# Patient Record
Sex: Female | Born: 1950 | Race: White | Hispanic: No | Marital: Married | Smoking: Never smoker
Health system: Southern US, Community
[De-identification: ages and names within clinical notes are randomized; demographics above are authoritative.]

---

## 1999-02-18 ENCOUNTER — Encounter: Payer: Self-pay | Admitting: Gynecology

## 1999-02-18 ENCOUNTER — Encounter: Admission: RE | Admit: 1999-02-18 | Discharge: 1999-02-18 | Payer: Self-pay | Admitting: Gynecology

## 2000-03-07 ENCOUNTER — Encounter: Admission: RE | Admit: 2000-03-07 | Discharge: 2000-03-07 | Payer: Self-pay | Admitting: Gynecology

## 2000-03-07 ENCOUNTER — Encounter: Payer: Self-pay | Admitting: Gynecology

## 2000-09-18 ENCOUNTER — Other Ambulatory Visit: Admission: RE | Admit: 2000-09-18 | Discharge: 2000-09-18 | Payer: Self-pay | Admitting: Gynecology

## 2001-01-16 ENCOUNTER — Encounter: Payer: Self-pay | Admitting: Family Medicine

## 2001-01-16 ENCOUNTER — Ambulatory Visit (HOSPITAL_COMMUNITY): Admission: RE | Admit: 2001-01-16 | Discharge: 2001-01-16 | Payer: Self-pay | Admitting: Family Medicine

## 2001-04-20 ENCOUNTER — Ambulatory Visit (HOSPITAL_COMMUNITY): Admission: RE | Admit: 2001-04-20 | Discharge: 2001-04-20 | Payer: Self-pay | Admitting: Family Medicine

## 2001-04-20 ENCOUNTER — Encounter: Payer: Self-pay | Admitting: Family Medicine

## 2001-06-12 ENCOUNTER — Encounter: Admission: RE | Admit: 2001-06-12 | Discharge: 2001-06-12 | Payer: Self-pay | Admitting: Gynecology

## 2001-06-12 ENCOUNTER — Encounter: Payer: Self-pay | Admitting: Gynecology

## 2001-10-10 ENCOUNTER — Other Ambulatory Visit: Admission: RE | Admit: 2001-10-10 | Discharge: 2001-10-10 | Payer: Self-pay | Admitting: Gynecology

## 2002-02-18 ENCOUNTER — Encounter: Admission: RE | Admit: 2002-02-18 | Discharge: 2002-02-18 | Payer: Self-pay | Admitting: Surgery

## 2002-02-18 ENCOUNTER — Encounter: Payer: Self-pay | Admitting: Surgery

## 2002-12-17 ENCOUNTER — Other Ambulatory Visit: Admission: RE | Admit: 2002-12-17 | Discharge: 2002-12-17 | Payer: Self-pay | Admitting: Gynecology

## 2004-01-20 ENCOUNTER — Other Ambulatory Visit: Admission: RE | Admit: 2004-01-20 | Discharge: 2004-01-20 | Payer: Self-pay | Admitting: Gynecology

## 2004-01-22 ENCOUNTER — Encounter: Admission: RE | Admit: 2004-01-22 | Discharge: 2004-01-22 | Payer: Self-pay | Admitting: Gynecology

## 2005-03-21 ENCOUNTER — Encounter: Admission: RE | Admit: 2005-03-21 | Discharge: 2005-03-21 | Payer: Self-pay | Admitting: Gynecology

## 2005-03-29 ENCOUNTER — Other Ambulatory Visit: Admission: RE | Admit: 2005-03-29 | Discharge: 2005-03-29 | Payer: Self-pay | Admitting: Gynecology

## 2005-04-06 ENCOUNTER — Encounter: Admission: RE | Admit: 2005-04-06 | Discharge: 2005-04-06 | Payer: Self-pay | Admitting: Gynecology

## 2006-03-27 ENCOUNTER — Encounter: Admission: RE | Admit: 2006-03-27 | Discharge: 2006-03-27 | Payer: Self-pay | Admitting: Gynecology

## 2006-04-11 ENCOUNTER — Other Ambulatory Visit: Admission: RE | Admit: 2006-04-11 | Discharge: 2006-04-11 | Payer: Self-pay | Admitting: Gynecology

## 2006-06-05 ENCOUNTER — Ambulatory Visit: Payer: Self-pay | Admitting: Gastroenterology

## 2006-06-20 ENCOUNTER — Ambulatory Visit: Payer: Self-pay | Admitting: Gastroenterology

## 2007-04-18 ENCOUNTER — Encounter: Admission: RE | Admit: 2007-04-18 | Discharge: 2007-04-18 | Payer: Self-pay | Admitting: Gynecology

## 2008-06-30 ENCOUNTER — Encounter: Admission: RE | Admit: 2008-06-30 | Discharge: 2008-06-30 | Payer: Self-pay | Admitting: Gynecology

## 2010-01-23 ENCOUNTER — Encounter: Payer: Self-pay | Admitting: Gynecology

## 2010-05-12 ENCOUNTER — Other Ambulatory Visit: Payer: Self-pay | Admitting: Gynecology

## 2010-05-12 DIAGNOSIS — Z1231 Encounter for screening mammogram for malignant neoplasm of breast: Secondary | ICD-10-CM

## 2010-05-16 ENCOUNTER — Ambulatory Visit
Admission: RE | Admit: 2010-05-16 | Discharge: 2010-05-16 | Disposition: A | Discharge status: Home / Self Care | Payer: Commercial Indemnity | Source: Ambulatory Visit | Attending: Gynecology | Admitting: Gynecology

## 2010-05-16 DIAGNOSIS — Z1231 Encounter for screening mammogram for malignant neoplasm of breast: Secondary | ICD-10-CM

## 2011-10-09 ENCOUNTER — Other Ambulatory Visit: Payer: Self-pay | Admitting: Gynecology

## 2011-10-09 DIAGNOSIS — Z1231 Encounter for screening mammogram for malignant neoplasm of breast: Secondary | ICD-10-CM

## 2011-11-13 ENCOUNTER — Ambulatory Visit
Admission: RE | Admit: 2011-11-13 | Discharge: 2011-11-13 | Disposition: A | Discharge status: Home / Self Care | Payer: Commercial Indemnity | Source: Ambulatory Visit | Attending: Gynecology | Admitting: Gynecology

## 2011-11-13 DIAGNOSIS — Z1231 Encounter for screening mammogram for malignant neoplasm of breast: Secondary | ICD-10-CM

## 2012-09-18 ENCOUNTER — Other Ambulatory Visit: Payer: Self-pay | Admitting: Family Medicine

## 2012-09-18 DIAGNOSIS — R110 Nausea: Secondary | ICD-10-CM

## 2012-09-18 DIAGNOSIS — R1011 Right upper quadrant pain: Secondary | ICD-10-CM

## 2012-09-23 ENCOUNTER — Ambulatory Visit
Admission: RE | Admit: 2012-09-23 | Discharge: 2012-09-23 | Disposition: A | Discharge status: Home / Self Care | Payer: Self-pay | Source: Ambulatory Visit | Attending: Family Medicine | Admitting: Family Medicine

## 2012-09-23 DIAGNOSIS — R1011 Right upper quadrant pain: Secondary | ICD-10-CM

## 2012-09-23 DIAGNOSIS — R110 Nausea: Secondary | ICD-10-CM

## 2013-08-12 ENCOUNTER — Other Ambulatory Visit: Payer: Self-pay

## 2013-08-12 DIAGNOSIS — Z1231 Encounter for screening mammogram for malignant neoplasm of breast: Secondary | ICD-10-CM

## 2013-08-18 ENCOUNTER — Ambulatory Visit
Admission: RE | Admit: 2013-08-18 | Discharge: 2013-08-18 | Disposition: A | Discharge status: Home / Self Care | Payer: Managed Care, Other (non HMO) | Source: Ambulatory Visit

## 2013-08-18 DIAGNOSIS — Z1231 Encounter for screening mammogram for malignant neoplasm of breast: Secondary | ICD-10-CM

## 2014-10-19 ENCOUNTER — Other Ambulatory Visit: Payer: Self-pay

## 2014-10-19 DIAGNOSIS — Z1231 Encounter for screening mammogram for malignant neoplasm of breast: Secondary | ICD-10-CM

## 2014-11-18 ENCOUNTER — Ambulatory Visit
Admission: RE | Admit: 2014-11-18 | Discharge: 2014-11-18 | Disposition: A | Discharge status: Home / Self Care | Payer: Managed Care, Other (non HMO) | Source: Ambulatory Visit

## 2014-11-18 DIAGNOSIS — Z1231 Encounter for screening mammogram for malignant neoplasm of breast: Secondary | ICD-10-CM

## 2015-11-11 ENCOUNTER — Other Ambulatory Visit: Payer: Self-pay | Admitting: Obstetrics and Gynecology

## 2015-11-11 DIAGNOSIS — Z1231 Encounter for screening mammogram for malignant neoplasm of breast: Secondary | ICD-10-CM

## 2015-12-13 ENCOUNTER — Ambulatory Visit
Admission: RE | Admit: 2015-12-13 | Discharge: 2015-12-13 | Disposition: A | Discharge status: Home / Self Care | Payer: Managed Care, Other (non HMO) | Source: Ambulatory Visit | Attending: Obstetrics and Gynecology | Admitting: Obstetrics and Gynecology

## 2015-12-13 DIAGNOSIS — Z1231 Encounter for screening mammogram for malignant neoplasm of breast: Secondary | ICD-10-CM

## 2016-11-14 ENCOUNTER — Other Ambulatory Visit: Payer: Self-pay | Admitting: Obstetrics and Gynecology

## 2016-11-14 DIAGNOSIS — Z1231 Encounter for screening mammogram for malignant neoplasm of breast: Secondary | ICD-10-CM

## 2016-12-13 ENCOUNTER — Ambulatory Visit
Admission: RE | Admit: 2016-12-13 | Discharge: 2016-12-13 | Disposition: A | Discharge status: Home / Self Care | Payer: Managed Care, Other (non HMO) | Source: Ambulatory Visit | Attending: Obstetrics and Gynecology | Admitting: Obstetrics and Gynecology

## 2016-12-13 DIAGNOSIS — Z1231 Encounter for screening mammogram for malignant neoplasm of breast: Secondary | ICD-10-CM

## 2018-01-28 ENCOUNTER — Other Ambulatory Visit: Payer: Self-pay | Admitting: Obstetrics and Gynecology

## 2018-01-28 DIAGNOSIS — Z1231 Encounter for screening mammogram for malignant neoplasm of breast: Secondary | ICD-10-CM

## 2018-02-19 ENCOUNTER — Ambulatory Visit
Admission: RE | Admit: 2018-02-19 | Discharge: 2018-02-19 | Disposition: A | Discharge status: Home / Self Care | Payer: Managed Care, Other (non HMO) | Source: Ambulatory Visit | Attending: Obstetrics and Gynecology | Admitting: Obstetrics and Gynecology

## 2018-02-19 DIAGNOSIS — Z1231 Encounter for screening mammogram for malignant neoplasm of breast: Secondary | ICD-10-CM

## 2018-07-09 ENCOUNTER — Other Ambulatory Visit: Payer: Self-pay | Admitting: Obstetrics and Gynecology

## 2018-07-09 DIAGNOSIS — R5381 Other malaise: Secondary | ICD-10-CM

## 2018-07-24 ENCOUNTER — Other Ambulatory Visit: Payer: Self-pay | Admitting: Obstetrics and Gynecology

## 2018-07-24 DIAGNOSIS — E2839 Other primary ovarian failure: Secondary | ICD-10-CM

## 2018-10-03 ENCOUNTER — Other Ambulatory Visit: Payer: Self-pay

## 2018-10-03 ENCOUNTER — Ambulatory Visit
Admission: RE | Admit: 2018-10-03 | Discharge: 2018-10-03 | Disposition: A | Discharge status: Home / Self Care | Payer: Managed Care, Other (non HMO) | Source: Ambulatory Visit | Attending: Obstetrics and Gynecology | Admitting: Obstetrics and Gynecology

## 2018-10-03 DIAGNOSIS — E2839 Other primary ovarian failure: Secondary | ICD-10-CM

## 2019-07-02 ENCOUNTER — Other Ambulatory Visit: Payer: Self-pay | Admitting: Family Medicine

## 2019-07-02 DIAGNOSIS — Z1231 Encounter for screening mammogram for malignant neoplasm of breast: Secondary | ICD-10-CM

## 2019-07-10 ENCOUNTER — Other Ambulatory Visit: Payer: Self-pay

## 2019-07-10 ENCOUNTER — Ambulatory Visit
Admission: RE | Admit: 2019-07-10 | Discharge: 2019-07-10 | Disposition: A | Discharge status: Home / Self Care | Payer: Managed Care, Other (non HMO) | Source: Ambulatory Visit | Attending: Family Medicine | Admitting: Family Medicine

## 2019-07-10 DIAGNOSIS — Z1231 Encounter for screening mammogram for malignant neoplasm of breast: Secondary | ICD-10-CM

## 2020-03-14 IMAGING — MG DIGITAL SCREENING BILATERAL MAMMOGRAM WITH TOMO AND CAD
5 series · 6 of 17 positions shown · non-contrast
Comparison: Previous exam(s).

CLINICAL DATA: Screening.

EXAM:
DIGITAL SCREENING BILATERAL MAMMOGRAM WITH TOMO AND CAD

[R CC synth-2D]
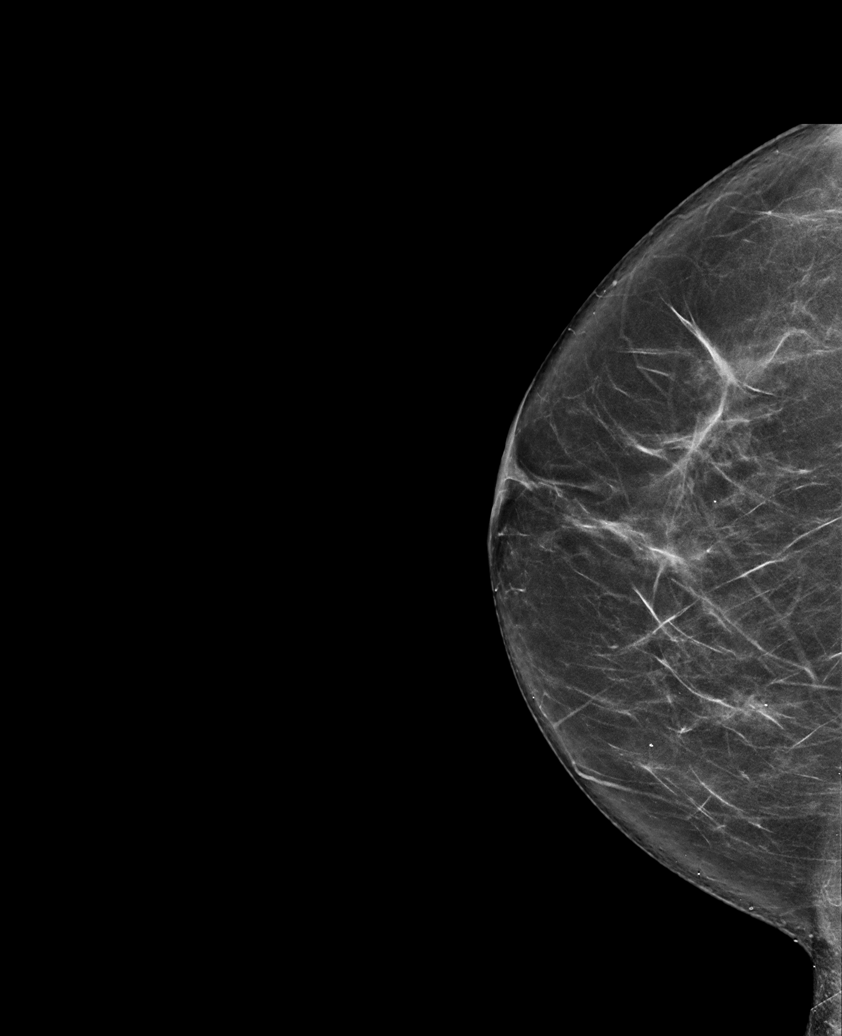

[L CC synth-2D]
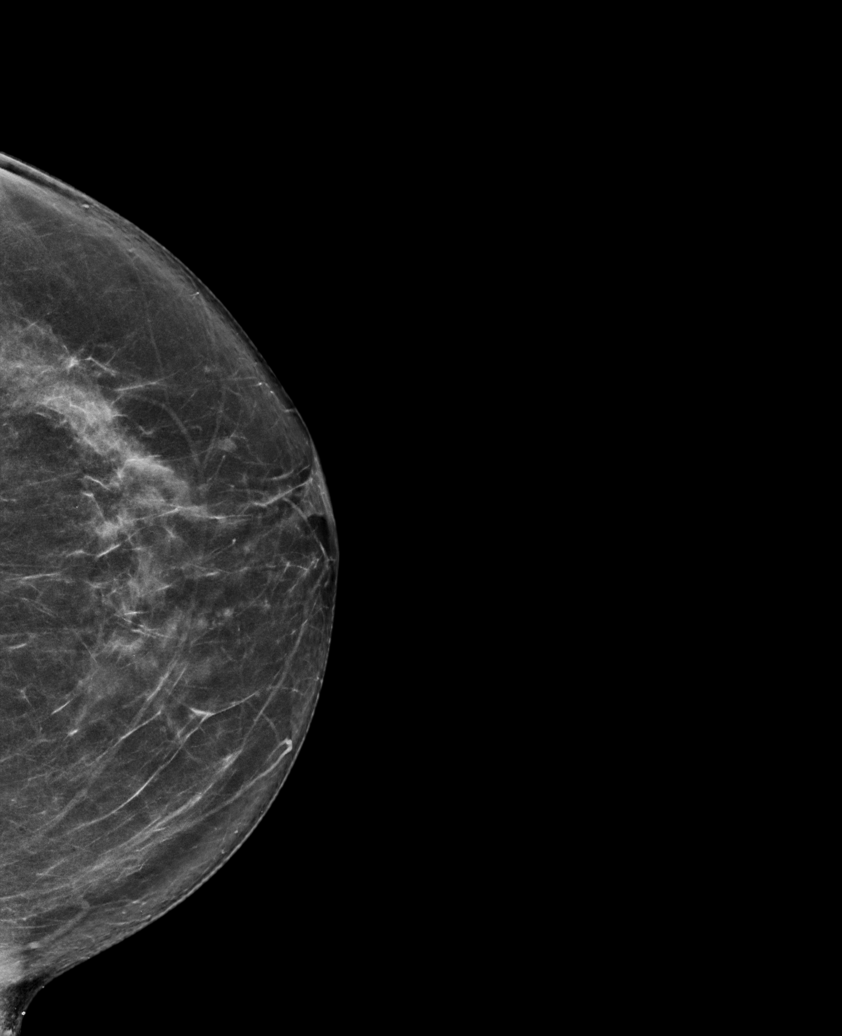

[R CC tomo · 2 of 78 frames shown]
[frame 26/78]
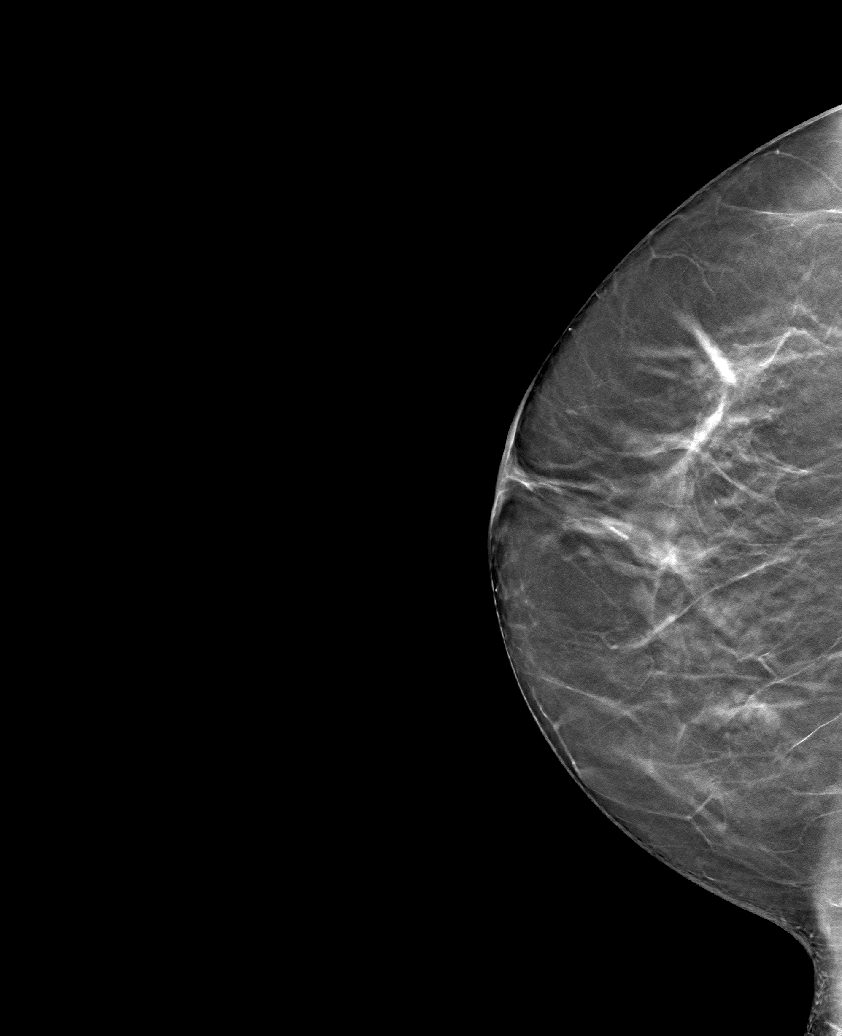
[frame 39/78]
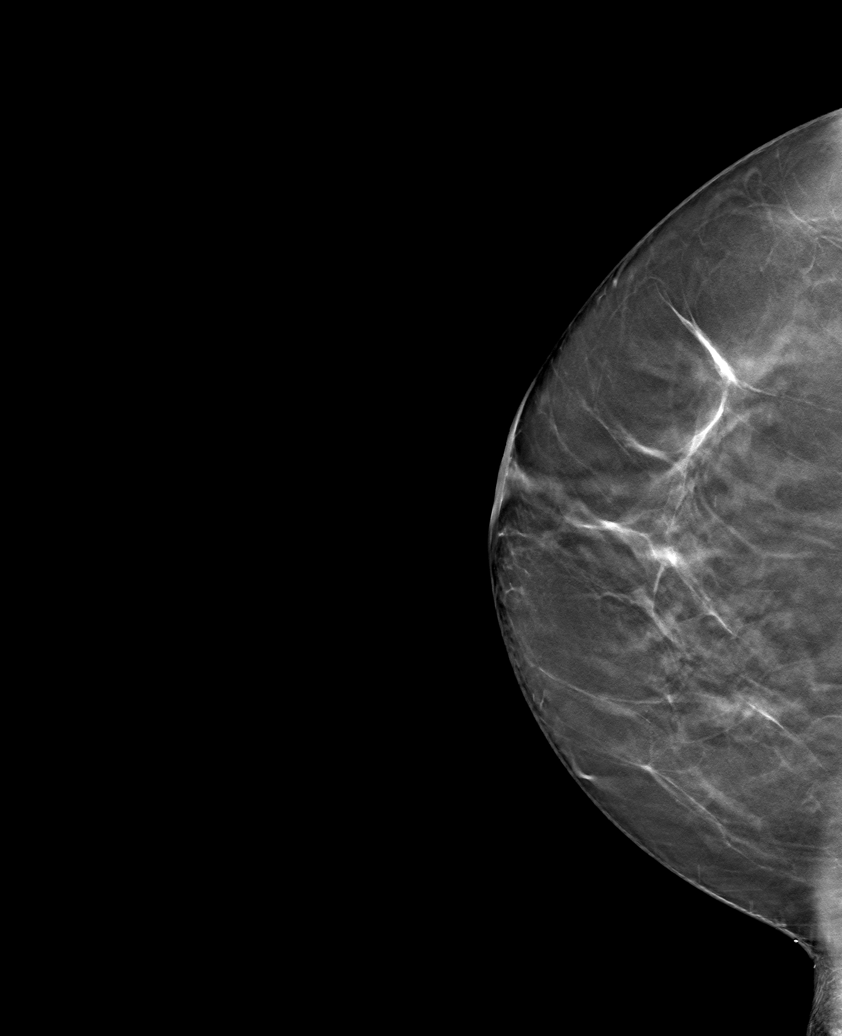

[L CC tomo · tomo slice 38/75.0]
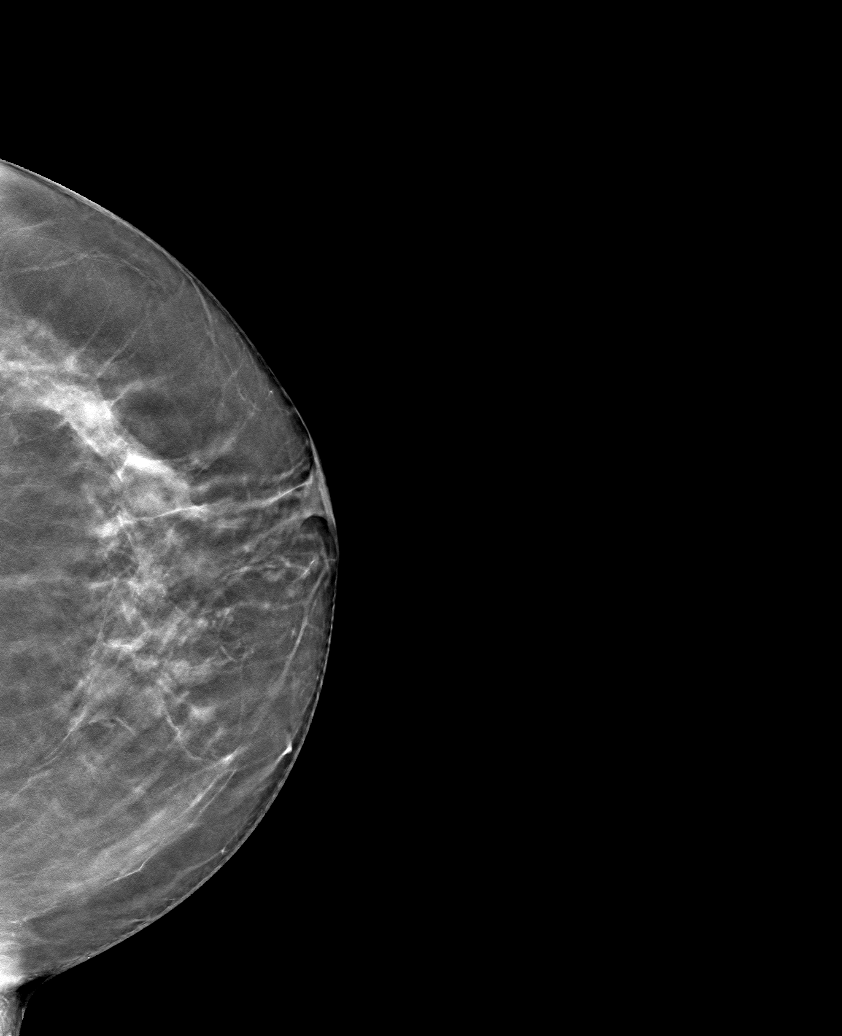

[R MLO tomo · tomo slice 39/76.0]
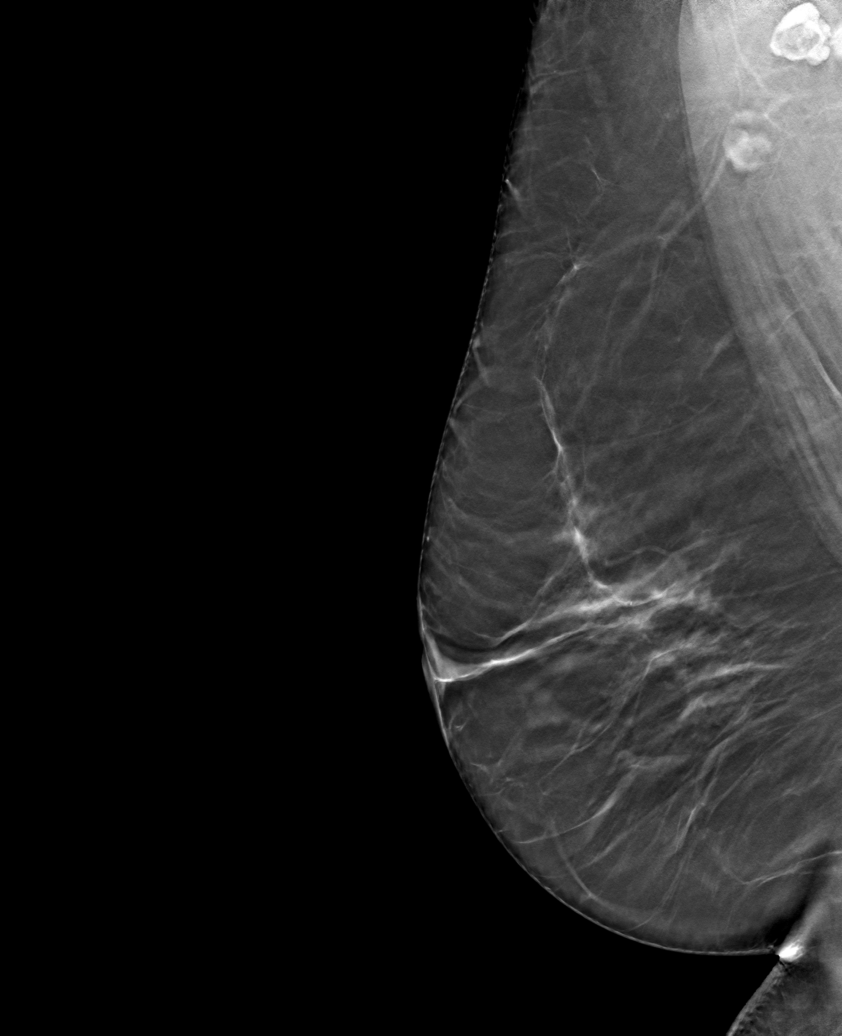

[6 of 17 positions shown; findings below may reference images not displayed]

ACR Breast Density Category c: The breast tissue is heterogeneously
dense, which may obscure small masses.
FINDINGS: There are no findings suspicious for malignancy. Images were
processed with CAD.
IMPRESSION: No mammographic evidence of malignancy. A result letter of this
screening mammogram will be mailed directly to the patient.

RECOMMENDATION:
Screening mammogram in one year. (Code:FT-U-LHB)

BI-RADS CATEGORY  1: Negative.

## 2020-07-13 ENCOUNTER — Other Ambulatory Visit: Payer: Self-pay | Admitting: Family Medicine

## 2020-07-13 DIAGNOSIS — Z1231 Encounter for screening mammogram for malignant neoplasm of breast: Secondary | ICD-10-CM

## 2020-09-07 ENCOUNTER — Ambulatory Visit
Admission: RE | Admit: 2020-09-07 | Discharge: 2020-09-07 | Disposition: A | Discharge status: Home / Self Care | Payer: Managed Care, Other (non HMO) | Source: Ambulatory Visit | Attending: Family Medicine | Admitting: Family Medicine

## 2020-09-07 ENCOUNTER — Other Ambulatory Visit: Payer: Self-pay

## 2020-09-07 DIAGNOSIS — Z1231 Encounter for screening mammogram for malignant neoplasm of breast: Secondary | ICD-10-CM

## 2021-03-14 ENCOUNTER — Other Ambulatory Visit: Payer: Self-pay

## 2021-03-14 ENCOUNTER — Encounter (HOSPITAL_COMMUNITY): Payer: Self-pay

## 2021-03-14 ENCOUNTER — Emergency Department (HOSPITAL_COMMUNITY)
Admission: EM | Admit: 2021-03-14 | Discharge: 2021-03-14 | Disposition: A | Discharge status: Home / Self Care | Payer: Managed Care, Other (non HMO) | Attending: Emergency Medicine | Admitting: Emergency Medicine

## 2021-03-14 ENCOUNTER — Emergency Department (HOSPITAL_COMMUNITY): Payer: Managed Care, Other (non HMO)

## 2021-03-14 DIAGNOSIS — Z23 Encounter for immunization: Secondary | ICD-10-CM | POA: Diagnosis not present

## 2021-03-14 DIAGNOSIS — S61511A Laceration without foreign body of right wrist, initial encounter: Secondary | ICD-10-CM

## 2021-03-14 DIAGNOSIS — W010XXA Fall on same level from slipping, tripping and stumbling without subsequent striking against object, initial encounter: Secondary | ICD-10-CM | POA: Insufficient documentation

## 2021-03-14 DIAGNOSIS — S51811A Laceration without foreign body of right forearm, initial encounter: Secondary | ICD-10-CM | POA: Diagnosis present

## 2021-03-14 MED ORDER — TETANUS-DIPHTH-ACELL PERTUSSIS 5-2.5-18.5 LF-MCG/0.5 IM SUSY
0.5000 mL | PREFILLED_SYRINGE | Freq: Once | INTRAMUSCULAR | Status: AC
  Administered 2021-03-14: 0.5 mL via INTRAMUSCULAR
  Filled 2021-03-14: qty 0.5

## 2021-03-14 MED ORDER — LIDOCAINE-EPINEPHRINE (PF) 2 %-1:200000 IJ SOLN
10.0000 mL | Freq: Once | INTRAMUSCULAR | Status: AC
  Administered 2021-03-14: 10 mL
  Filled 2021-03-14: qty 20

## 2021-03-14 NOTE — ED Provider Notes (Incomplete)
°  [facility]-EMERGENCY DEPT Provider Note   CSN: [id number] Arrival date & time: 03/14/21  1946     History {Add pertinent medical, surgical, social history, OB history to HPI:1} Chief Complaint  Patient presents with   Laceration    Magdala Brahmbhatt is a 71 y.o. female.   Laceration     Home Medications Prior to Admission medications   Not on File      Allergies    Aspirin    Review of Systems   Review of Systems  Physical Exam Updated Vital Signs BP (!) 153/92 (BP Location: Left Arm)    Pulse 85    Temp 98.1 F (36.7 C) (Oral)    Resp 20    SpO2 98%  Physical Exam  ED Results / Procedures / Treatments   Labs (all labs ordered are listed, but only abnormal results are displayed) Labs Reviewed - No data to display  EKG None  Radiology DG Wrist Complete Right  Result Date: 03/14/2021 CLINICAL DATA:  Right wrist pain. EXAM: RIGHT WRIST - COMPLETE 3+ VIEW COMPARISON:  None. FINDINGS: There is no acute fracture or dislocation. The bones are osteopenic. There is degenerative changes of the base of the thumb. The soft tissues are unremarkable. IMPRESSION: 1. No acute fracture or dislocation. 2. Osteopenia. Electronically Signed   By: Elgie Collard M.D.   On: 03/14/2021 20:40    Procedures Procedures  {Document cardiac monitor, telemetry assessment procedure when appropriate:1}  Medications Ordered in ED Medications - No data to display  ED Course/ Medical Decision Making/ A&P                           Medical Decision Making  ***  {Document critical care time when appropriate:1} {Document review of labs and clinical decision tools ie heart score, Chads2Vasc2 etc:1}  {Document your independent review of radiology images, and any outside records:1} {Document your discussion with family members, caretakers, and with consultants:1} {Document social determinants of health affecting pt's care:1} {Document your decision making why or  why not admission, treatments were needed:1} Final Clinical Impression(s) / ED Diagnoses Final diagnoses:  None    Rx / DC Orders ED Discharge Orders     None

## 2021-03-14 NOTE — ED Triage Notes (Signed)
Patient arrives home, states she fell into a piece of metal tonight and injured her wrist. Pt has laceration to right wrist, bleeding controlled.  ?

## 2021-03-14 NOTE — Discharge Instructions (Signed)
You came to the emergency department today due to your wrist laceration.  Your x-ray showed no broken bones or dislocations.  Your laceration required stiches.  Please follow up with your primary care provider, urgent care or return to the emergency department in 7-10 days to have your stitches removed.   ? ?Please keep the wound dry for the next 24 hours.  After that you may gently wash the area with soap and water.  Do not submerge the wound under water until the stiches are removed.    ? ?Get help right away if: ?You develop severe swelling around the wound. ?Your pain suddenly increases and is severe. ?You develop painful lumps near the wound or on skin anywhere else on your body. ?You have a red streak going away from your wound. ?The wound is on your hand or foot, and you cannot properly move a finger or toe. ?The wound is on your hand or foot, and you notice that your fingers or toes look pale or bluish. ?

## 2021-09-06 ENCOUNTER — Other Ambulatory Visit: Payer: Self-pay | Admitting: Family Medicine

## 2021-09-14 ENCOUNTER — Other Ambulatory Visit: Payer: Self-pay | Admitting: Family Medicine

## 2021-09-14 DIAGNOSIS — N644 Mastodynia: Secondary | ICD-10-CM

## 2021-09-27 ENCOUNTER — Ambulatory Visit: Admission: RE | Admit: 2021-09-27 | Payer: Managed Care, Other (non HMO) | Source: Ambulatory Visit

## 2021-09-27 ENCOUNTER — Ambulatory Visit
Admission: RE | Admit: 2021-09-27 | Discharge: 2021-09-27 | Disposition: A | Discharge status: Home / Self Care | Payer: Managed Care, Other (non HMO) | Source: Ambulatory Visit | Attending: Family Medicine | Admitting: Family Medicine

## 2021-09-27 DIAGNOSIS — N644 Mastodynia: Secondary | ICD-10-CM

## 2022-09-26 ENCOUNTER — Other Ambulatory Visit: Payer: Self-pay | Admitting: Family Medicine

## 2022-09-26 DIAGNOSIS — Z0000 Encounter for general adult medical examination without abnormal findings: Secondary | ICD-10-CM

## 2022-10-19 ENCOUNTER — Ambulatory Visit
Admission: RE | Admit: 2022-10-19 | Discharge: 2022-10-19 | Disposition: A | Discharge status: Home / Self Care | Payer: Managed Care, Other (non HMO) | Source: Ambulatory Visit | Attending: Family Medicine | Admitting: Family Medicine

## 2022-10-19 DIAGNOSIS — Z0000 Encounter for general adult medical examination without abnormal findings: Secondary | ICD-10-CM

## 2022-10-24 ENCOUNTER — Other Ambulatory Visit: Payer: Self-pay | Admitting: Family Medicine

## 2022-10-24 DIAGNOSIS — R928 Other abnormal and inconclusive findings on diagnostic imaging of breast: Secondary | ICD-10-CM

## 2022-11-10 ENCOUNTER — Other Ambulatory Visit: Payer: Managed Care, Other (non HMO)

## 2022-11-21 ENCOUNTER — Other Ambulatory Visit: Payer: Self-pay | Admitting: Family Medicine

## 2022-11-21 ENCOUNTER — Ambulatory Visit
Admission: RE | Admit: 2022-11-21 | Discharge: 2022-11-21 | Disposition: A | Discharge status: Home / Self Care | Payer: Managed Care, Other (non HMO) | Source: Ambulatory Visit | Attending: Family Medicine | Admitting: Family Medicine

## 2022-11-21 DIAGNOSIS — N6489 Other specified disorders of breast: Secondary | ICD-10-CM

## 2022-11-21 DIAGNOSIS — R928 Other abnormal and inconclusive findings on diagnostic imaging of breast: Secondary | ICD-10-CM

## 2023-05-22 ENCOUNTER — Other Ambulatory Visit: Payer: Self-pay | Admitting: Family Medicine

## 2023-05-22 ENCOUNTER — Ambulatory Visit
Admission: RE | Admit: 2023-05-22 | Discharge: 2023-05-22 | Disposition: A | Discharge status: Home / Self Care | Payer: Managed Care, Other (non HMO) | Source: Ambulatory Visit | Attending: Family Medicine | Admitting: Family Medicine

## 2023-05-22 DIAGNOSIS — N6489 Other specified disorders of breast: Secondary | ICD-10-CM

## 2023-10-21 ENCOUNTER — Other Ambulatory Visit (HOSPITAL_BASED_OUTPATIENT_CLINIC_OR_DEPARTMENT_OTHER): Payer: Self-pay | Admitting: Family Medicine

## 2023-10-21 DIAGNOSIS — E2839 Other primary ovarian failure: Secondary | ICD-10-CM

## 2023-12-17 ENCOUNTER — Ambulatory Visit
Admission: RE | Admit: 2023-12-17 | Discharge: 2023-12-17 | Disposition: A | Source: Ambulatory Visit | Attending: Family Medicine | Admitting: Family Medicine

## 2023-12-17 DIAGNOSIS — N6489 Other specified disorders of breast: Secondary | ICD-10-CM

## 2024-05-21 ENCOUNTER — Other Ambulatory Visit (HOSPITAL_BASED_OUTPATIENT_CLINIC_OR_DEPARTMENT_OTHER)
